# Patient Record
Sex: Male | Born: 1965
Health system: Southern US, Community
[De-identification: ages and names within clinical notes are randomized; demographics above are authoritative.]

## PROBLEM LIST (undated history)

## (undated) DIAGNOSIS — H9190 Unspecified hearing loss, unspecified ear: Secondary | ICD-10-CM

## (undated) DIAGNOSIS — C649 Malignant neoplasm of unspecified kidney, except renal pelvis: Secondary | ICD-10-CM

## (undated) DIAGNOSIS — E785 Hyperlipidemia, unspecified: Secondary | ICD-10-CM

## (undated) HISTORY — PX: TOTAL NEPHRECTOMY: SHX415

---

## 2012-12-02 DIAGNOSIS — M159 Polyosteoarthritis, unspecified: Secondary | ICD-10-CM | POA: Insufficient documentation

## 2012-12-02 DIAGNOSIS — J301 Allergic rhinitis due to pollen: Secondary | ICD-10-CM | POA: Insufficient documentation

## 2013-10-16 DIAGNOSIS — F17200 Nicotine dependence, unspecified, uncomplicated: Secondary | ICD-10-CM | POA: Insufficient documentation

## 2013-10-16 DIAGNOSIS — H6980 Other specified disorders of Eustachian tube, unspecified ear: Secondary | ICD-10-CM | POA: Insufficient documentation

## 2013-10-16 DIAGNOSIS — H905 Unspecified sensorineural hearing loss: Secondary | ICD-10-CM | POA: Insufficient documentation

## 2014-05-04 DIAGNOSIS — E782 Mixed hyperlipidemia: Secondary | ICD-10-CM | POA: Insufficient documentation

## 2015-05-01 DIAGNOSIS — Z8042 Family history of malignant neoplasm of prostate: Secondary | ICD-10-CM | POA: Insufficient documentation

## 2015-05-01 DIAGNOSIS — N411 Chronic prostatitis: Secondary | ICD-10-CM | POA: Insufficient documentation

## 2017-02-10 ENCOUNTER — Emergency Department (HOSPITAL_BASED_OUTPATIENT_CLINIC_OR_DEPARTMENT_OTHER)
Admission: EM | Admit: 2017-02-10 | Discharge: 2017-02-10 | Disposition: A | Discharge status: Home / Self Care | Payer: 59 | Attending: Emergency Medicine | Admitting: Emergency Medicine

## 2017-02-10 ENCOUNTER — Emergency Department (HOSPITAL_BASED_OUTPATIENT_CLINIC_OR_DEPARTMENT_OTHER): Payer: 59

## 2017-02-10 ENCOUNTER — Other Ambulatory Visit: Payer: Self-pay

## 2017-02-10 ENCOUNTER — Encounter (HOSPITAL_BASED_OUTPATIENT_CLINIC_OR_DEPARTMENT_OTHER): Payer: Self-pay | Admitting: *Deleted

## 2017-02-10 DIAGNOSIS — M25511 Pain in right shoulder: Secondary | ICD-10-CM | POA: Insufficient documentation

## 2017-02-10 DIAGNOSIS — G8929 Other chronic pain: Secondary | ICD-10-CM | POA: Diagnosis not present

## 2017-02-10 DIAGNOSIS — Z79899 Other long term (current) drug therapy: Secondary | ICD-10-CM | POA: Insufficient documentation

## 2017-02-10 DIAGNOSIS — F17210 Nicotine dependence, cigarettes, uncomplicated: Secondary | ICD-10-CM | POA: Insufficient documentation

## 2017-02-10 HISTORY — DX: Hyperlipidemia, unspecified: E78.5

## 2017-02-10 HISTORY — DX: Unspecified hearing loss, unspecified ear: H91.90

## 2017-02-10 NOTE — ED Notes (Signed)
ED Provider at bedside. 

## 2017-02-10 NOTE — ED Triage Notes (Signed)
Pt c/o right shoulder injury x 4 days ago increased pain with movt

## 2017-02-10 NOTE — ED Provider Notes (Signed)
[facility] EMERGENCY DEPARTMENT Provider Note   CSN: [id number] Arrival date & time: 02/10/17  1540     History   Chief Complaint Chief Complaint  Patient presents with  . Shoulder Injury    HPI Maurice Watkins is a 52 y.o. male.  HPI  Maurice Watkins male with a history of hyperlipidemia who presents to the emergency department for evaluation of right shoulder pain.  Patient states that he has had this pain for several months, although seems to have worsened over the past 4 days.  He reports that pain is located grossly over the right shoulder and upper arm.  Pain is moderate in severity and "dull and aching" in nature.  Shoulder pain seems to be worse when he is lying still or when he tries to bring his right hand to his left shoulder.  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  He denies any specific injury that he can think of.  Reports that being at work makes his shoulder pain worse.  Has tried taking ibuprofen and meloxicam, but has not had significant relief.  He denies numbness, weakness, fever, chills, open wound.   Past Medical History:  Diagnosis Date  . HOH (hard of hearing)   . Hyperlipidemia     There are no active problems to display for this patient.   History reviewed. No pertinent surgical history.     Home Medications    Prior to Admission medications   Medication Sig Start Date End Date Taking? Authorizing Provider  pravastatin (PRAVACHOL) 20 MG tablet Take 20 mg by mouth daily.   Yes [provider]    Family History History reviewed. No pertinent family history.  Social History Social History   Tobacco Use  . Smoking status: Current Every Day Smoker    Packs/day: 0.50    Types: Cigarettes  . Smokeless tobacco: Never Used  Substance Use Topics  . Alcohol use: No    Frequency: Never  . Drug use: No     Allergies   Meloxicam   Review of Systems Review of  Systems  Constitutional: Negative for chills, fatigue and fever.  Musculoskeletal: Positive for arthralgias (right shoulder). Negative for joint swelling.  Skin: Negative for rash and wound.  Neurological: Negative for weakness and numbness.     Physical Exam Updated Vital Signs BP (!) 145/85   Pulse 87   Temp 97.8 F (36.6 C)   Resp 16   Ht 5\' 5"  (1.651 m)   Wt 65.8 kg (145 lb)   SpO2 100%   BMI 24.13 kg/m   Physical Exam  Constitutional: He appears well-developed and well-nourished. No distress.  HENT:  Head: Normocephalic and atraumatic.  Eyes: Right eye exhibits no discharge. Left eye exhibits no discharge.  Pulmonary/Chest: Effort normal. No respiratory distress.  Musculoskeletal: Normal range of motion.  Right shoulder grossly tender to palpation. No specific point tenderness over the clavicle, scapular spine or humeral head. Full active ROM, although tender with abduction. Negative empty can test, Negative Neer's. No swelling, erythema or ecchymosis present. No step-off, crepitus, or deformity appreciated. 5/5 muscle strength of UE. 2+ radial pulse, sensation intact and all compartments soft.   Neurological: He is alert. Coordination normal.  Skin: Skin is warm and dry. Capillary refill takes less than 2 seconds. He is not diaphoretic.  Psychiatric: He has a normal mood and affect. His behavior is normal.  Nursing note and  vitals reviewed.    ED Treatments / Results  Labs (all labs ordered are listed, but only abnormal results are displayed) Labs Reviewed - No data to display  EKG  EKG Interpretation None       Radiology Dg Shoulder Right  Result Date: 02/10/2017 CLINICAL DATA:  Right shoulder pain for several months. EXAM: RIGHT SHOULDER - 2+ VIEW COMPARISON:  None. FINDINGS: The glenohumeral joint is normal. There are mild AC joint degenerative changes. No acute abnormality or abnormal soft tissue calcifications. The visualized right lung and right ribs  are intact. IMPRESSION: No acute or significant bony findings. Electronically Signed   By: Marijo Sanes M.D.   On: 02/10/2017 09:57    Procedures Procedures (including critical care time)  Medications Ordered in ED Medications - No data to display   Initial Impression / Assessment and Plan / ED Course  I have reviewed the triage vital signs and the nursing notes.  Pertinent labs & imaging results that were available during my care of the patient were reviewed by me and considered in my medical decision making (see chart for details).  Clinical Course as of Feb 10 1010  Thu Feb 10, 2017  1012 DG Shoulder Right [ES]    Clinical Course User Index [ES] Glyn Ade, PA-C   Patient presents with chronic right shoulder pain.  X-ray of right shoulder negative for acute fracture or abnormality.  Strength 5/5 and right upper extremity is neurovascularly intact.  Have counseled patient on use of NSAIDs for pain and will give him information to follow-up with sports medicine.  Discussed return precautions and patient agrees and voices understanding.  His blood pressure was elevated in the ER today, counseled him to have this rechecked.  Final Clinical Impressions(s) / ED Diagnoses   Final diagnoses:  Chronic right shoulder pain    ED Discharge Orders    None       Bernarda Caffey 02/10/17 1117    Fatima Blank, MD 02/10/17 [phone]

## 2017-02-10 NOTE — Discharge Instructions (Signed)
The x-ray of your shoulder was reassuring.  No fracture or dislocation.  Please take 600 mg ibuprofen every 6 hours as needed for pain.  I have listed the information below to sports medicine Dr. Barbaraann Barthel for follow-up, you can also contact your primary care doctor for follow-up if your symptoms are not improving.  Your blood pressure was elevated in the ER today, please have this rechecked.  Return to the ER if you have any new or worsening symptoms.

## 2017-02-15 ENCOUNTER — Ambulatory Visit (INDEPENDENT_AMBULATORY_CARE_PROVIDER_SITE_OTHER): Payer: 59 | Admitting: Family Medicine

## 2017-02-15 ENCOUNTER — Encounter: Payer: Self-pay | Admitting: Family Medicine

## 2017-02-15 VITALS — BP 117/73 | HR 85 | Ht 66.0 in | Wt 142.0 lb

## 2017-02-15 DIAGNOSIS — M25511 Pain in right shoulder: Secondary | ICD-10-CM | POA: Diagnosis not present

## 2017-02-15 DIAGNOSIS — G8929 Other chronic pain: Secondary | ICD-10-CM | POA: Diagnosis not present

## 2017-02-15 MED ORDER — NITROGLYCERIN 0.2 MG/HR TD PT24: MEDICATED_PATCH | TRANSDERMAL | 1 refills | Status: AC

## 2017-02-15 NOTE — Patient Instructions (Signed)
You have rotator cuff impingement. Try to avoid painful activities (overhead activities, lifting with extended arm) as much as possible. Consider aspercreme up to 4 times a day topically for pain and inflammation.  Voltaren gel is a consideration as well - let me know if you want me to send this in. Nitro patches 1/4th patch to affected shoulder, change daily. Can take tylenol in addition to this. Subacromial injection may be beneficial to help with pain and to decrease inflammation. Consider physical therapy with transition to home exercise program. Do home exercise program with theraband and scapular stabilization exercises daily 3 sets of 10 once a day - start with yellow theraband, advance to using the red one when tolerated. If not improving at follow-up we will consider imaging, injection, physical therapy. Follow up with me in 6 weeks for reevaluation.

## 2017-02-15 NOTE — Progress Notes (Signed)
PCP: Health, [facility]  Subjective:   HPI: Patient is a 52 y.o. male here for right shoulder pain.  Patient reports about 4 months ago he started to get pain in lateral, superior, posterior right shoulder. 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 No acute injury. Has tried ibuprofen, icy hot, heat, mobic (allergic reaction to this). Cannot sleep due to pain. Pain level 8/10 and sharp. No skin changes, numbness.  Past Medical History:  Diagnosis Date  . HOH (hard of hearing)   . Hyperlipidemia     Current Outpatient Medications on File Prior to Visit  Medication Sig Dispense Refill  . azelastine (ASTELIN) 0.1 % nasal spray Place into the nose.    . tamsulosin (FLOMAX) 0.4 MG CAPS capsule Take 0.4 mg by mouth.    . pravastatin (PRAVACHOL) 20 MG tablet Take 20 mg by mouth daily.     No current facility-administered medications on file prior to visit.     History reviewed. No pertinent surgical history.  Allergies  Allergen Reactions  . Eggs Or Egg-Derived Products Other (See Comments)    No reaction listed-ask pt and enter Childhood   . Influenza Vaccines   . Meloxicam     Social History   Socioeconomic History  . Marital status: Married    Spouse name: Not on file  . Number of children: Not on file  . Years of education: Not on file  . Highest education level: Not on file  Social Needs  . Financial resource strain: Not on file  . Food insecurity - worry: Not on file  . Food insecurity - inability: Not on file  . Transportation needs - medical: Not on file  . Transportation needs - non-medical: Not on file  Occupational History  . Not on file  Tobacco Use  . Smoking status: Current Every Day Smoker    Packs/day: 0.50    Types: Cigarettes  . Smokeless tobacco: Never Used  Substance and Sexual Activity  . Alcohol use: No    Frequency: Never  . Drug use: No  . Sexual  activity: Not on file  Other Topics Concern  . Not on file  Social History Narrative  . Not on file    History reviewed. No pertinent family history.  BP 117/73   Pulse 85   Ht 5\' 6"  (1.676 m)   Wt 142 lb (64.4 kg)   BMI 22.92 kg/m   Review of Systems: See HPI above.     Objective:  Physical Exam:  Gen: NAD, comfortable in exam room  Right shoulder: No swelling, ecchymoses.  No gross deformity. No TTP AC joint, biceps tendon. FROM with painful arc. Positive Hawkins, Neers. Negative Yergasons. Strength 5/5 with empty can and resisted internal/external rotation.  Pain empty can, ER. Negative apprehension. NV intact distally.  Left shoulder: No swelling, ecchymoses.  No gross deformity. No TTP. FROM. Strength 5/5 with empty can and resisted internal/external rotation. NV intact distally.   Assessment & Plan:  1. Right shoulder pain - consistent with rotator cuff impingement from repetitive activities at work.  Discussed options - will start with home exercise program, nitro patches.  Topical aspercreme.  Consider voltaren gel, imaging, injection, PT if not improving.  F/u in 6 weeks.

## 2017-02-15 NOTE — Assessment & Plan Note (Signed)
consistent with rotator cuff impingement from repetitive activities at work.  Discussed options - will start with home exercise program, nitro patches.  Topical aspercreme.  Consider voltaren gel, imaging, injection, PT if not improving.  F/u in 6 weeks.

## 2017-02-27 ENCOUNTER — Emergency Department (HOSPITAL_BASED_OUTPATIENT_CLINIC_OR_DEPARTMENT_OTHER): Payer: 59

## 2017-02-27 ENCOUNTER — Emergency Department (HOSPITAL_BASED_OUTPATIENT_CLINIC_OR_DEPARTMENT_OTHER)
Admission: EM | Admit: 2017-02-27 | Discharge: 2017-02-27 | Disposition: A | Discharge status: Home / Self Care | Payer: 59 | Attending: Emergency Medicine | Admitting: Emergency Medicine

## 2017-02-27 ENCOUNTER — Encounter (HOSPITAL_BASED_OUTPATIENT_CLINIC_OR_DEPARTMENT_OTHER): Payer: Self-pay | Admitting: Emergency Medicine

## 2017-02-27 DIAGNOSIS — Z79899 Other long term (current) drug therapy: Secondary | ICD-10-CM | POA: Diagnosis not present

## 2017-02-27 DIAGNOSIS — R39198 Other difficulties with micturition: Secondary | ICD-10-CM | POA: Diagnosis not present

## 2017-02-27 DIAGNOSIS — J111 Influenza due to unidentified influenza virus with other respiratory manifestations: Secondary | ICD-10-CM | POA: Diagnosis not present

## 2017-02-27 DIAGNOSIS — R109 Unspecified abdominal pain: Secondary | ICD-10-CM | POA: Diagnosis present

## 2017-02-27 DIAGNOSIS — R3912 Poor urinary stream: Secondary | ICD-10-CM | POA: Insufficient documentation

## 2017-02-27 DIAGNOSIS — R69 Illness, unspecified: Secondary | ICD-10-CM

## 2017-02-27 DIAGNOSIS — F17210 Nicotine dependence, cigarettes, uncomplicated: Secondary | ICD-10-CM | POA: Diagnosis not present

## 2017-02-27 DIAGNOSIS — N411 Chronic prostatitis: Secondary | ICD-10-CM | POA: Diagnosis not present

## 2017-02-27 DIAGNOSIS — Z7902 Long term (current) use of antithrombotics/antiplatelets: Secondary | ICD-10-CM | POA: Insufficient documentation

## 2017-02-27 LAB — URINALYSIS, ROUTINE W REFLEX MICROSCOPIC
Bilirubin Urine: NEGATIVE
Glucose, UA: NEGATIVE mg/dL
Ketones, ur: NEGATIVE mg/dL
Leukocytes, UA: NEGATIVE
NITRITE: NEGATIVE
PH: 7 (ref 5.0–8.0)
Protein, ur: NEGATIVE mg/dL

## 2017-02-27 LAB — URINALYSIS, MICROSCOPIC (REFLEX): SQUAMOUS EPITHELIAL / LPF: NONE SEEN

## 2017-02-27 LAB — CBG MONITORING, ED: GLUCOSE-CAPILLARY: 87 mg/dL (ref 65–99)

## 2017-02-27 MED ORDER — KETOROLAC TROMETHAMINE 30 MG/ML IJ SOLN
30.0000 mg | Freq: Once | INTRAMUSCULAR | Status: AC
  Administered 2017-02-27: 30 mg via INTRAVENOUS
  Filled 2017-02-27: qty 1

## 2017-02-27 MED ORDER — TAMSULOSIN HCL 0.4 MG PO CAPS: 0.4000 mg | ORAL_CAPSULE | Freq: Every day | ORAL | 0 refills | Status: AC

## 2017-02-27 MED ORDER — CIPROFLOXACIN HCL 500 MG PO TABS: 500.0000 mg | ORAL_TABLET | Freq: Two times a day (BID) | ORAL | 0 refills | Status: AC

## 2017-02-27 NOTE — ED Provider Notes (Addendum)
[facility] EMERGENCY DEPARTMENT Provider Note   CSN: [id number] Arrival date & time: 02/27/17  1546     History   Chief Complaint Chief Complaint  Patient presents with  . Flank Pain    HPI Maurice Watkins is a 52 y.o. male.  HPI Patient presents to the emergency department with decreased flow of urination.  The patient states that he is having difficulty urinating.  He states there is some discomfort also noted.  The patient states that he has had a history of chronic prostatitis that is caused some urinary issues like this in the past.  He states that he is also had some intermittent flank pain over the last few days.  Patient states that he has a son at home who is sick with influenza and is concerned that he may be developing this as well.  The patient states that nothing seems to make the condition better or worse.  The patient states that he normally will take Flomax for his symptoms when they occur.  The patient denies chest pain, shortness of breath, headache,blurred vision, neck pain, fever, cough, weakness, numbness, dizziness, anorexia, edema,  nausea, vomiting, diarrhea, rash, back pain, dysuria, hematemesis, bloody stool, near syncope, or syncope. Past Medical History:  Diagnosis Date  . HOH (hard of hearing)   . Hyperlipidemia     Patient Active Problem List   Diagnosis Date Noted  . Right shoulder pain 02/15/2017  . Chronic prostatitis 05/01/2015  . Family history of prostate cancer in father 05/01/2015  . Mixed hyperlipidemia 05/04/2014  . Sensorineural hearing loss 10/16/2013  . Current smoker 10/16/2013  . ETD (eustachian tube dysfunction) 10/16/2013  . Generalized osteoarthritis of multiple sites 12/02/2012  . Allergic rhinitis due to pollen 12/02/2012    History reviewed. No pertinent surgical history.     Home Medications    Prior to Admission medications   Medication Sig Start Date End Date Taking? Authorizing Provider  azelastine  (ASTELIN) 0.1 % nasal spray Place into the nose. 04/07/16   [provider]  nitroGLYCERIN (NITRODUR - DOSED IN MG/24 HR) 0.2 mg/hr patch Apply 1/4th patch to affected shoulder, change daily 02/15/17   Hudnall, Sharyn Lull, MD  pravastatin (PRAVACHOL) 20 MG tablet Take 20 mg by mouth daily.    [provider]  tamsulosin (FLOMAX) 0.4 MG CAPS capsule Take 0.4 mg by mouth. 08/21/14   [provider]    Family History No family history on file.  Social History Social History   Tobacco Use  . Smoking status: Current Every Day Smoker    Packs/day: 0.50    Types: Cigarettes  . Smokeless tobacco: Never Used  Substance Use Topics  . Alcohol use: No    Frequency: Never  . Drug use: No     Allergies   Eggs or egg-derived products; Influenza vaccines; and Meloxicam   Review of Systems Review of Systems All other systems negative except as documented in the HPI. All pertinent positives and negatives as reviewed in the HPI. Physical Exam Updated Vital Signs BP 111/75 (BP Location: Right Arm)   Pulse 93   Temp (!) 100.4 F (38 C) (Oral)   Resp 18   SpO2 96%   Physical Exam  Constitutional: He is oriented to person, place, and time. He appears well-developed and well-nourished. No distress.  HENT:  Head: Normocephalic and atraumatic.  Mouth/Throat: Oropharynx is clear and moist.  Eyes: Pupils are equal, round, and reactive to light.  Neck: Normal range  of motion. Neck supple.  Cardiovascular: Normal rate, regular rhythm and normal heart sounds. Exam reveals no gallop and no friction rub.  No murmur heard. Pulmonary/Chest: Effort normal and breath sounds normal. No respiratory distress. He has no wheezes.  Abdominal: Soft. Bowel sounds are normal. He exhibits no distension and no mass. There is tenderness in the suprapubic area. There is no guarding.  Neurological: He is alert and oriented to person, place, and time. He exhibits normal muscle tone. Coordination  normal.  Skin: Skin is warm and dry. Capillary refill takes less than 2 seconds. No rash noted. No erythema.  Psychiatric: He has a normal mood and affect. His behavior is normal.  Nursing note and vitals reviewed.    ED Treatments / Results  Labs (all labs ordered are listed, but only abnormal results are displayed) Labs Reviewed  URINALYSIS, ROUTINE W REFLEX MICROSCOPIC - Abnormal; Notable for the following components:      Result Value   Specific Gravity, Urine <1.005 (*)    Hgb urine dipstick TRACE (*)    All other components within normal limits  URINALYSIS, MICROSCOPIC (REFLEX) - Abnormal; Notable for the following components:   Bacteria, UA FEW (*)    All other components within normal limits  CBG MONITORING, ED    EKG  EKG Interpretation None       Radiology Ct Renal Stone Study  Result Date: 02/27/2017 CLINICAL DATA:  Left flank pain.  Stone disease suspected. EXAM: CT ABDOMEN AND PELVIS WITHOUT CONTRAST TECHNIQUE: Multidetector CT imaging of the abdomen and pelvis was performed following the standard protocol without IV contrast. COMPARISON:  None. FINDINGS: Lower chest: No acute findings. Hepatobiliary: No mass visualized on this unenhanced exam. Gallbladder is unremarkable. Pancreas: No mass or inflammatory process visualized on this unenhanced exam. Spleen:  Within normal limits in size. Adrenals/Urinary tract: No evidence of urolithiasis or hydronephrosis. Unremarkable unopacified urinary bladder. Stomach/Bowel: No evidence of obstruction, inflammatory process, or abnormal fluid collections. Vascular/Lymphatic: No pathologically enlarged lymph nodes identified. No evidence of abdominal aortic aneurysm. Aortic atherosclerosis. Reproductive:  No mass or other significant abnormality. Other:  None. Musculoskeletal:  No suspicious bone lesions identified. IMPRESSION: No evidence of urolithiasis, hydronephrosis, or other acute findings. The Aortic atherosclerosis.  Electronically Signed   By: Earle Gell M.D.   On: 02/27/2017 18:47    Procedures Procedures (including critical care time)  Medications Ordered in ED Medications - No data to display   Initial Impression / Assessment and Plan / ED Course  I have reviewed the triage vital signs and the nursing notes.  Pertinent labs & imaging results that were available during my care of the patient were reviewed by me and considered in my medical decision making (see chart for details).     Patient also has some upper respiratory symptoms that could be consistent with the flu like his son has.  The patient is advised to return here as needed.  Follow-up with his urologist.  Patient is advised to also follow-up with his primary doctor.  Patient CT scan did not show any significant abnormalities as related to his current issue.  I did advise the patient to return for any worsening in his condition.  I feel that this is a prostate related issue prostatitis we will treat for this.  Final Clinical Impressions(s) / ED Diagnoses   Final diagnoses:  None    ED Discharge Orders    None       Dalia Heading, [place] 02/27/17 1931  Isla Pence, MD 02/27/17 2201    Dalia Heading, PA-C 03/08/17 [place], Julie, MD 03/08/17 [phone]

## 2017-02-27 NOTE — ED Notes (Signed)
RX x 2 given for flomax and cipro. Note for work given

## 2017-02-27 NOTE — ED Triage Notes (Signed)
Pt c/o LT flank pain; reports urine stream weaker since Fri

## 2017-02-27 NOTE — Discharge Instructions (Addendum)
follow-up with your urologist. return here as needed.  Increase fluid intake and rest as much as possible.

## 2017-02-27 NOTE — ED Notes (Signed)
Patient transported to CT 

## 2017-03-01 DIAGNOSIS — N410 Acute prostatitis: Secondary | ICD-10-CM | POA: Diagnosis not present

## 2017-03-01 DIAGNOSIS — K5900 Constipation, unspecified: Secondary | ICD-10-CM | POA: Diagnosis not present

## 2017-03-01 DIAGNOSIS — R319 Hematuria, unspecified: Secondary | ICD-10-CM | POA: Diagnosis not present

## 2017-03-02 DIAGNOSIS — R531 Weakness: Secondary | ICD-10-CM | POA: Diagnosis not present

## 2017-03-18 DIAGNOSIS — R310 Gross hematuria: Secondary | ICD-10-CM | POA: Diagnosis not present

## 2017-03-18 DIAGNOSIS — I700 Atherosclerosis of aorta: Secondary | ICD-10-CM | POA: Diagnosis not present

## 2017-03-18 DIAGNOSIS — N2889 Other specified disorders of kidney and ureter: Secondary | ICD-10-CM | POA: Diagnosis not present

## 2017-03-21 DIAGNOSIS — R310 Gross hematuria: Secondary | ICD-10-CM | POA: Diagnosis not present

## 2017-03-21 DIAGNOSIS — Z01810 Encounter for preprocedural cardiovascular examination: Secondary | ICD-10-CM | POA: Diagnosis not present

## 2017-03-21 DIAGNOSIS — E782 Mixed hyperlipidemia: Secondary | ICD-10-CM | POA: Diagnosis not present

## 2017-03-21 DIAGNOSIS — C642 Malignant neoplasm of left kidney, except renal pelvis: Secondary | ICD-10-CM | POA: Diagnosis not present

## 2017-03-24 DIAGNOSIS — R310 Gross hematuria: Secondary | ICD-10-CM | POA: Diagnosis not present

## 2017-03-29 ENCOUNTER — Encounter: Payer: Self-pay | Admitting: Family Medicine

## 2017-03-29 ENCOUNTER — Ambulatory Visit: Payer: 59 | Admitting: Family Medicine

## 2017-03-29 DIAGNOSIS — M25511 Pain in right shoulder: Secondary | ICD-10-CM | POA: Diagnosis not present

## 2017-03-29 DIAGNOSIS — G8929 Other chronic pain: Secondary | ICD-10-CM | POA: Diagnosis not present

## 2017-03-29 NOTE — Patient Instructions (Addendum)
You can stop the nitro patches now but don't throw them away just in case the pain comes back (though this is unlikely at this point). Do the home exercises 3 times a week for the next 4 weeks. Follow up with me as needed. Good luck with the surgery next Tuesday!

## 2017-03-29 NOTE — Assessment & Plan Note (Signed)
2/2 rotator cuff impingement.  Doing well.  Stop nitro patches.  Continue home exercises 3x/week for 4 more weeks.  Tylenol, motrin only if needed.  F/u prn.

## 2017-03-29 NOTE — Progress Notes (Signed)
PCP: Health, [facility]  Subjective:   HPI: Patient is a 52 y.o. male here for right shoulder pain.  1/15: Patient reports about 4 months ago he started to get pain in lateral, superior, posterior right shoulder. 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 No acute injury. Has tried ibuprofen, icy hot, heat, mobic (allergic reaction to this). Cannot sleep due to pain. Pain level 8/10 and sharp. No skin changes, numbness.  2/26: Patient reports he's doing well. No pain now. Able to sleep at  Night without a problem. Has been using full patches on his shoulder instead of 1/4th patch. No skin changes, numbness.  Past Medical History:  Diagnosis Date  . HOH (hard of hearing)   . Hyperlipidemia     Current Outpatient Medications on File Prior to Visit  Medication Sig Dispense Refill  . Ascorbic Acid (VITAMIN C) 100 MG tablet Take by mouth.    Marland Kitchen azelastine (ASTELIN) 0.1 % nasal spray Place into the nose.    . B Complex Vitamins (VITAMIN B COMPLEX PO) Take by mouth.    . Cholecalciferol (VITAMIN D-1000 MAX ST) 1000 units tablet Take by mouth.    Marland Kitchen NICODERM CQ 21 MG/24HR patch     . nitroGLYCERIN (NITRODUR - DOSED IN MG/24 HR) 0.2 mg/hr patch Apply 1/4th patch to affected shoulder, change daily 30 patch 1  . oxybutynin (DITROPAN-XL) 10 MG 24 hr tablet     . pravastatin (PRAVACHOL) 20 MG tablet Take 20 mg by mouth daily.    . tamsulosin (FLOMAX) 0.4 MG CAPS capsule Take 1 capsule (0.4 mg total) by mouth daily. 30 capsule 0  . vitamin A 10000 UNIT capsule Take by mouth.    . vitamin E 100 UNIT capsule Take by mouth.     No current facility-administered medications on file prior to visit.     History reviewed. No pertinent surgical history.  Allergies  Allergen Reactions  . Eggs Or Egg-Derived Products Other (See Comments)    No reaction listed-ask pt and enter Childhood   . Influenza  Vaccines   . Meloxicam     Social History   Socioeconomic History  . Marital status: Married    Spouse name: Not on file  . Number of children: Not on file  . Years of education: Not on file  . Highest education level: Not on file  Social Needs  . Financial resource strain: Not on file  . Food insecurity - worry: Not on file  . Food insecurity - inability: Not on file  . Transportation needs - medical: Not on file  . Transportation needs - non-medical: Not on file  Occupational History  . Not on file  Tobacco Use  . Smoking status: Current Every Day Smoker    Packs/day: 0.50    Types: Cigarettes  . Smokeless tobacco: Never Used  Substance and Sexual Activity  . Alcohol use: No    Frequency: Never  . Drug use: No  . Sexual activity: Not on file  Other Topics Concern  . Not on file  Social History Narrative  . Not on file    History reviewed. No pertinent family history.  BP 118/78   Pulse 86   Ht 5\' 5"  (1.651 m)   Wt 139 lb (63 kg)   BMI 23.13 kg/m   Review of Systems: See HPI above.     Objective:  Physical Exam:  Gen: NAD, comfortable in exam room.  Right shoulder: No swelling, ecchymoses.  No gross deformity. No TTP. FROM. Negative Hawkins, Neers. Negative Yergasons. Strength 5/5 with empty can and resisted internal/external rotation. NV intact distally.   Assessment & Plan:  1. Right shoulder pain - 2/2 rotator cuff impingement.  Doing well.  Stop nitro patches.  Continue home exercises 3x/week for 4 more weeks.  Tylenol, motrin only if needed.  F/u prn.

## 2017-03-31 DIAGNOSIS — K921 Melena: Secondary | ICD-10-CM | POA: Diagnosis not present

## 2017-03-31 DIAGNOSIS — K648 Other hemorrhoids: Secondary | ICD-10-CM | POA: Diagnosis not present

## 2017-03-31 DIAGNOSIS — K6289 Other specified diseases of anus and rectum: Secondary | ICD-10-CM | POA: Diagnosis not present

## 2017-04-01 DIAGNOSIS — R310 Gross hematuria: Secondary | ICD-10-CM | POA: Diagnosis not present

## 2017-04-01 DIAGNOSIS — C801 Malignant (primary) neoplasm, unspecified: Secondary | ICD-10-CM | POA: Diagnosis not present

## 2017-04-01 DIAGNOSIS — K648 Other hemorrhoids: Secondary | ICD-10-CM | POA: Diagnosis not present

## 2017-04-01 DIAGNOSIS — Z01812 Encounter for preprocedural laboratory examination: Secondary | ICD-10-CM | POA: Diagnosis not present

## 2017-04-01 DIAGNOSIS — Z01810 Encounter for preprocedural cardiovascular examination: Secondary | ICD-10-CM | POA: Diagnosis not present

## 2017-04-05 DIAGNOSIS — C642 Malignant neoplasm of left kidney, except renal pelvis: Secondary | ICD-10-CM | POA: Diagnosis not present

## 2017-04-05 DIAGNOSIS — M2141 Flat foot [pes planus] (acquired), right foot: Secondary | ICD-10-CM | POA: Diagnosis not present

## 2017-04-05 DIAGNOSIS — M722 Plantar fascial fibromatosis: Secondary | ICD-10-CM | POA: Diagnosis not present

## 2017-04-05 DIAGNOSIS — R066 Hiccough: Secondary | ICD-10-CM | POA: Diagnosis not present

## 2017-04-05 DIAGNOSIS — K9131 Postprocedural partial intestinal obstruction: Secondary | ICD-10-CM | POA: Diagnosis not present

## 2017-04-05 DIAGNOSIS — R140 Abdominal distension (gaseous): Secondary | ICD-10-CM | POA: Diagnosis not present

## 2017-04-05 DIAGNOSIS — M24571 Contracture, right ankle: Secondary | ICD-10-CM | POA: Diagnosis not present

## 2017-04-05 DIAGNOSIS — K56600 Partial intestinal obstruction, unspecified as to cause: Secondary | ICD-10-CM | POA: Diagnosis not present

## 2017-04-05 DIAGNOSIS — C652 Malignant neoplasm of left renal pelvis: Secondary | ICD-10-CM | POA: Diagnosis not present

## 2017-04-05 DIAGNOSIS — I10 Essential (primary) hypertension: Secondary | ICD-10-CM | POA: Diagnosis not present

## 2017-04-05 DIAGNOSIS — R310 Gross hematuria: Secondary | ICD-10-CM | POA: Diagnosis not present

## 2017-04-05 DIAGNOSIS — R000 Tachycardia, unspecified: Secondary | ICD-10-CM | POA: Diagnosis not present

## 2017-04-05 DIAGNOSIS — Z452 Encounter for adjustment and management of vascular access device: Secondary | ICD-10-CM | POA: Diagnosis not present

## 2017-04-05 DIAGNOSIS — R1084 Generalized abdominal pain: Secondary | ICD-10-CM | POA: Diagnosis not present

## 2017-04-05 DIAGNOSIS — Z978 Presence of other specified devices: Secondary | ICD-10-CM | POA: Diagnosis not present

## 2017-04-05 DIAGNOSIS — Z9889 Other specified postprocedural states: Secondary | ICD-10-CM | POA: Diagnosis not present

## 2017-04-05 DIAGNOSIS — E871 Hypo-osmolality and hyponatremia: Secondary | ICD-10-CM | POA: Diagnosis not present

## 2017-04-05 DIAGNOSIS — R933 Abnormal findings on diagnostic imaging of other parts of digestive tract: Secondary | ICD-10-CM | POA: Diagnosis not present

## 2017-04-05 DIAGNOSIS — G8918 Other acute postprocedural pain: Secondary | ICD-10-CM | POA: Diagnosis not present

## 2017-04-05 DIAGNOSIS — E875 Hyperkalemia: Secondary | ICD-10-CM | POA: Diagnosis not present

## 2017-04-05 DIAGNOSIS — N179 Acute kidney failure, unspecified: Secondary | ICD-10-CM | POA: Diagnosis not present

## 2017-04-05 DIAGNOSIS — Z905 Acquired absence of kidney: Secondary | ICD-10-CM | POA: Diagnosis not present

## 2017-04-05 DIAGNOSIS — K567 Ileus, unspecified: Secondary | ICD-10-CM | POA: Diagnosis not present

## 2017-04-05 DIAGNOSIS — K56609 Unspecified intestinal obstruction, unspecified as to partial versus complete obstruction: Secondary | ICD-10-CM | POA: Diagnosis not present

## 2017-04-21 DIAGNOSIS — N179 Acute kidney failure, unspecified: Secondary | ICD-10-CM | POA: Diagnosis not present

## 2017-04-21 DIAGNOSIS — E876 Hypokalemia: Secondary | ICD-10-CM | POA: Diagnosis not present

## 2017-04-21 DIAGNOSIS — K56609 Unspecified intestinal obstruction, unspecified as to partial versus complete obstruction: Secondary | ICD-10-CM | POA: Diagnosis not present

## 2017-04-21 DIAGNOSIS — T8130XA Disruption of wound, unspecified, initial encounter: Secondary | ICD-10-CM | POA: Diagnosis not present

## 2017-04-29 DIAGNOSIS — N179 Acute kidney failure, unspecified: Secondary | ICD-10-CM | POA: Diagnosis not present

## 2017-04-29 DIAGNOSIS — N390 Urinary tract infection, site not specified: Secondary | ICD-10-CM | POA: Diagnosis not present

## 2017-04-29 DIAGNOSIS — E875 Hyperkalemia: Secondary | ICD-10-CM | POA: Diagnosis not present

## 2017-04-29 DIAGNOSIS — R310 Gross hematuria: Secondary | ICD-10-CM | POA: Diagnosis not present

## 2017-05-05 DIAGNOSIS — K625 Hemorrhage of anus and rectum: Secondary | ICD-10-CM | POA: Diagnosis not present

## 2017-05-06 DIAGNOSIS — K921 Melena: Secondary | ICD-10-CM | POA: Diagnosis not present

## 2017-05-06 DIAGNOSIS — K6289 Other specified diseases of anus and rectum: Secondary | ICD-10-CM | POA: Diagnosis not present

## 2017-05-10 DIAGNOSIS — K921 Melena: Secondary | ICD-10-CM | POA: Diagnosis not present

## 2017-05-10 DIAGNOSIS — K6389 Other specified diseases of intestine: Secondary | ICD-10-CM | POA: Diagnosis not present

## 2017-05-10 DIAGNOSIS — K529 Noninfective gastroenteritis and colitis, unspecified: Secondary | ICD-10-CM | POA: Diagnosis not present

## 2017-05-10 DIAGNOSIS — K641 Second degree hemorrhoids: Secondary | ICD-10-CM | POA: Diagnosis not present

## 2017-05-13 ENCOUNTER — Emergency Department (HOSPITAL_BASED_OUTPATIENT_CLINIC_OR_DEPARTMENT_OTHER)
Admission: EM | Admit: 2017-05-13 | Discharge: 2017-05-13 | Disposition: A | Discharge status: Home / Self Care | Payer: 59 | Attending: Emergency Medicine | Admitting: Emergency Medicine

## 2017-05-13 ENCOUNTER — Encounter (HOSPITAL_BASED_OUTPATIENT_CLINIC_OR_DEPARTMENT_OTHER): Payer: Self-pay

## 2017-05-13 ENCOUNTER — Other Ambulatory Visit: Payer: Self-pay

## 2017-05-13 DIAGNOSIS — M79601 Pain in right arm: Secondary | ICD-10-CM | POA: Diagnosis present

## 2017-05-13 DIAGNOSIS — I808 Phlebitis and thrombophlebitis of other sites: Secondary | ICD-10-CM | POA: Diagnosis not present

## 2017-05-13 DIAGNOSIS — M79602 Pain in left arm: Secondary | ICD-10-CM | POA: Diagnosis not present

## 2017-05-13 DIAGNOSIS — Z79899 Other long term (current) drug therapy: Secondary | ICD-10-CM | POA: Diagnosis not present

## 2017-05-13 DIAGNOSIS — Z87891 Personal history of nicotine dependence: Secondary | ICD-10-CM | POA: Insufficient documentation

## 2017-05-13 HISTORY — DX: Malignant neoplasm of unspecified kidney, except renal pelvis: C64.9

## 2017-05-13 NOTE — Discharge Instructions (Addendum)
Contact a health care provider if: You have unusual bruising or any bleeding problems. Your swelling or pain in the affected area is not improving. You are on anti-inflammatory medicine, and you develop belly (abdominal) pain. Get help right away if: You have a sudden onset of chest pain or difficulty breathing. You have a fever or persistent symptoms for more than 2-3 days. You have a fever and your symptoms suddenly get worse.

## 2017-05-13 NOTE — ED Triage Notes (Signed)
C/o "knots" to bilat UE-first noticed today-states he had recent surgery with multiple IV and a colonoscopy to left arm this week-NAD-steady gait

## 2017-05-13 NOTE — ED Provider Notes (Signed)
[facility]PART[facility] Provider Note   CSN: [id number] Arrival date & time: 05/13/17  2020     History   Chief Complaint Chief Complaint  Patient presents with  . Arm Problem    HPI Maurice Watkins is a 52 y.o. male  Who presents to the ER with cc of "knots" in his arms. The patient has a hx of recent nephrectomy for RCC. He had multiple IVs in both arms. He noticed painful cordlike lesions in both arms over the past week. He denies heat, redness, UL UE swelling, cp or sob.  HPI  Past Medical History:  Diagnosis Date  . Cancer of kidney ([place])   . HOH (hard of hearing)   . Hyperlipidemia     Patient Active Problem List   Diagnosis Date Noted  . Right shoulder pain 02/15/2017  . Chronic prostatitis 05/01/2015  . Family history of prostate cancer in father 05/01/2015  . Mixed hyperlipidemia 05/04/2014  . Sensorineural hearing loss 10/16/2013  . Current smoker 10/16/2013  . ETD (eustachian tube dysfunction) 10/16/2013  . Generalized osteoarthritis of multiple sites 12/02/2012  . Allergic rhinitis due to pollen 12/02/2012    Past Surgical History:  Procedure Laterality Date  . TOTAL NEPHRECTOMY          Home Medications    Prior to Admission medications   Medication Sig Start Date End Date Taking? Authorizing Provider  Ascorbic Acid (VITAMIN C) 100 MG tablet Take by mouth.    [provider]  azelastine (ASTELIN) 0.1 % nasal spray Place into the nose. 04/07/16   [provider]  B Complex Vitamins (VITAMIN B COMPLEX PO) Take by mouth.    [provider]  Cholecalciferol (VITAMIN D-1000 MAX ST) 1000 units tablet Take by mouth.    [provider]  NICODERM CQ 21 MG/24HR patch  03/24/17   [provider]  nitroGLYCERIN (NITRODUR - DOSED IN MG/24 HR) 0.2 mg/hr patch Apply 1/4th patch to affected shoulder, change daily 02/15/17   Hudnall, Sharyn Lull, MD  oxybutynin (DITROPAN-XL) 10 MG 24 hr tablet  03/21/17    [provider]  pravastatin (PRAVACHOL) 20 MG tablet Take 20 mg by mouth daily.    [provider]  tamsulosin (FLOMAX) 0.4 MG CAPS capsule Take 1 capsule (0.4 mg total) by mouth daily. 02/27/17   Lawyer, Harrell Gave, PA-C  vitamin A 10000 UNIT capsule Take by mouth.    [provider]  vitamin E 100 UNIT capsule Take by mouth.    [provider]    Family History No family history on file.  Social History Social History   Tobacco Use  . Smoking status: Former Smoker    Packs/day: 0.50    Types: Cigarettes  . Smokeless tobacco: Never Used  Substance Use Topics  . Alcohol use: No    Frequency: Never  . Drug use: No     Allergies   Eggs or egg-derived products; Influenza vaccines; and Meloxicam   Review of Systems Review of Systems  Ten systems reviewed and are negative for acute change, except as noted in the HPI.   Physical Exam Updated Vital Signs BP 135/77 (BP Location: Left Arm)   Pulse 96   Temp 98.8 F (37.1 C) (Oral)   Resp 18   Ht 5\' 6"  (1.676 m)   Wt 57.2 kg (126 lb 1.7 oz)   SpO2 100%   BMI 20.35 kg/m   Physical Exam  Constitutional: He appears well-developed and well-nourished.  No distress.  HENT:  Head: Normocephalic and atraumatic.  Eyes: Conjunctivae are normal. No scleral icterus.  Neck: Normal range of motion. Neck supple.  Cardiovascular: Normal rate, regular rhythm, normal heart sounds and intact distal pulses.  Tender, ropy, cordlike Lesions in single vein distribution bl UE. Normal radial pulses. No swelling.   Pulmonary/Chest: Effort normal and breath sounds normal. No respiratory distress.  Abdominal: Soft. There is no tenderness.  Musculoskeletal: Normal range of motion. He exhibits no edema.  Neurological: He is alert.  Skin: Skin is warm and dry. He is not diaphoretic.  Psychiatric: His behavior is normal.  Nursing note and vitals reviewed.    ED Treatments / Results  Labs (all labs ordered  are listed, but only abnormal results are displayed) Labs Reviewed - No data to display  EKG None  Radiology No results found.  Procedures Procedures (including critical care time)  Medications Ordered in ED Medications - No data to display   Initial Impression / Assessment and Plan / ED Course  I have reviewed the triage vital signs and the nursing notes.  Pertinent labs & imaging results that were available during my care of the patient were reviewed by me and considered in my medical decision making (see chart for details).     Superficial thrombophlebitis of BL upper extremities. No concern for cellulitis or DVT.  Discussed supportive care and return precautions.  Final Clinical Impressions(s) / ED Diagnoses   Final diagnoses:  Thrombophlebitis of arm    ED Discharge Orders    None       Margarita Mail, PA-C 05/13/17 2243    Blanchie Dessert, MD 05/14/17 [id number]

## 2017-05-17 DIAGNOSIS — I809 Phlebitis and thrombophlebitis of unspecified site: Secondary | ICD-10-CM | POA: Diagnosis not present

## 2017-05-24 DIAGNOSIS — R7989 Other specified abnormal findings of blood chemistry: Secondary | ICD-10-CM | POA: Diagnosis not present

## 2017-05-24 DIAGNOSIS — D649 Anemia, unspecified: Secondary | ICD-10-CM | POA: Diagnosis not present

## 2017-05-24 DIAGNOSIS — I809 Phlebitis and thrombophlebitis of unspecified site: Secondary | ICD-10-CM | POA: Diagnosis not present

## 2017-05-31 DIAGNOSIS — H903 Sensorineural hearing loss, bilateral: Secondary | ICD-10-CM | POA: Diagnosis not present

## 2017-05-31 DIAGNOSIS — H6983 Other specified disorders of Eustachian tube, bilateral: Secondary | ICD-10-CM | POA: Diagnosis not present

## 2017-05-31 DIAGNOSIS — R7989 Other specified abnormal findings of blood chemistry: Secondary | ICD-10-CM | POA: Diagnosis not present

## 2017-06-07 DIAGNOSIS — C642 Malignant neoplasm of left kidney, except renal pelvis: Secondary | ICD-10-CM | POA: Diagnosis not present

## 2017-06-08 DIAGNOSIS — R310 Gross hematuria: Secondary | ICD-10-CM | POA: Diagnosis not present

## 2017-06-08 DIAGNOSIS — E875 Hyperkalemia: Secondary | ICD-10-CM | POA: Diagnosis not present

## 2017-06-08 DIAGNOSIS — Z7689 Persons encountering health services in other specified circumstances: Secondary | ICD-10-CM | POA: Diagnosis not present

## 2017-06-08 DIAGNOSIS — N179 Acute kidney failure, unspecified: Secondary | ICD-10-CM | POA: Diagnosis not present

## 2017-06-10 DIAGNOSIS — E875 Hyperkalemia: Secondary | ICD-10-CM | POA: Diagnosis not present

## 2017-06-10 DIAGNOSIS — R310 Gross hematuria: Secondary | ICD-10-CM | POA: Diagnosis not present

## 2017-06-10 DIAGNOSIS — N179 Acute kidney failure, unspecified: Secondary | ICD-10-CM | POA: Diagnosis not present

## 2017-06-16 DIAGNOSIS — I808 Phlebitis and thrombophlebitis of other sites: Secondary | ICD-10-CM | POA: Diagnosis not present

## 2017-06-16 DIAGNOSIS — M79601 Pain in right arm: Secondary | ICD-10-CM | POA: Diagnosis not present

## 2017-07-15 DIAGNOSIS — C642 Malignant neoplasm of left kidney, except renal pelvis: Secondary | ICD-10-CM | POA: Diagnosis not present

## 2017-08-06 ENCOUNTER — Encounter (HOSPITAL_BASED_OUTPATIENT_CLINIC_OR_DEPARTMENT_OTHER): Payer: Self-pay | Admitting: Emergency Medicine

## 2017-08-06 ENCOUNTER — Emergency Department (HOSPITAL_BASED_OUTPATIENT_CLINIC_OR_DEPARTMENT_OTHER): Payer: 59

## 2017-08-06 ENCOUNTER — Other Ambulatory Visit: Payer: Self-pay

## 2017-08-06 ENCOUNTER — Emergency Department (HOSPITAL_BASED_OUTPATIENT_CLINIC_OR_DEPARTMENT_OTHER)
Admission: EM | Admit: 2017-08-06 | Discharge: 2017-08-06 | Disposition: A | Discharge status: Home / Self Care | Payer: 59 | Attending: Emergency Medicine | Admitting: Emergency Medicine

## 2017-08-06 DIAGNOSIS — K625 Hemorrhage of anus and rectum: Secondary | ICD-10-CM | POA: Diagnosis not present

## 2017-08-06 DIAGNOSIS — Z85528 Personal history of other malignant neoplasm of kidney: Secondary | ICD-10-CM | POA: Diagnosis not present

## 2017-08-06 DIAGNOSIS — Z79899 Other long term (current) drug therapy: Secondary | ICD-10-CM | POA: Insufficient documentation

## 2017-08-06 DIAGNOSIS — R1030 Lower abdominal pain, unspecified: Secondary | ICD-10-CM | POA: Diagnosis not present

## 2017-08-06 DIAGNOSIS — D649 Anemia, unspecified: Secondary | ICD-10-CM | POA: Insufficient documentation

## 2017-08-06 DIAGNOSIS — Z87891 Personal history of nicotine dependence: Secondary | ICD-10-CM | POA: Diagnosis not present

## 2017-08-06 DIAGNOSIS — R1032 Left lower quadrant pain: Secondary | ICD-10-CM | POA: Diagnosis not present

## 2017-08-06 LAB — URINALYSIS, ROUTINE W REFLEX MICROSCOPIC
Bilirubin Urine: NEGATIVE
Glucose, UA: NEGATIVE mg/dL
Ketones, ur: NEGATIVE mg/dL
Leukocytes, UA: NEGATIVE
NITRITE: NEGATIVE
Protein, ur: NEGATIVE mg/dL
pH: 6 (ref 5.0–8.0)

## 2017-08-06 LAB — CBC WITH DIFFERENTIAL/PLATELET
BASOS PCT: 0 %
Basophils Absolute: 0 10*3/uL (ref 0.0–0.1)
EOS ABS: 0 10*3/uL (ref 0.0–0.7)
Eosinophils Relative: 1 %
HCT: 36.6 % — ABNORMAL LOW (ref 39.0–52.0)
Hemoglobin: 12.6 g/dL — ABNORMAL LOW (ref 13.0–17.0)
LYMPHS ABS: 2.5 10*3/uL (ref 0.7–4.0)
Lymphocytes Relative: 53 %
MCH: 30.8 pg (ref 26.0–34.0)
MCHC: 34.4 g/dL (ref 30.0–36.0)
MCV: 89.5 fL (ref 78.0–100.0)
Monocytes Absolute: 0.5 10*3/uL (ref 0.1–1.0)
Monocytes Relative: 12 %
NEUTROS ABS: 1.6 10*3/uL — AB (ref 1.7–7.7)
Neutrophils Relative %: 34 %
Platelets: 248 10*3/uL (ref 150–400)
RBC: 4.09 MIL/uL — AB (ref 4.22–5.81)
RDW: 12.7 % (ref 11.5–15.5)
WBC: 4.7 10*3/uL (ref 4.0–10.5)

## 2017-08-06 LAB — COMPREHENSIVE METABOLIC PANEL
ALBUMIN: 4 g/dL (ref 3.5–5.0)
ALT: 28 U/L (ref 0–44)
AST: 24 U/L (ref 15–41)
Alkaline Phosphatase: 76 U/L (ref 38–126)
Anion gap: 6 (ref 5–15)
BUN: 26 mg/dL — AB (ref 6–20)
CO2: 27 mmol/L (ref 22–32)
CREATININE: 1.56 mg/dL — AB (ref 0.61–1.24)
Calcium: 9 mg/dL (ref 8.9–10.3)
Chloride: 105 mmol/L (ref 98–111)
GFR calc Af Amer: 57 mL/min — ABNORMAL LOW (ref >60)
GFR calc non Af Amer: 49 mL/min — ABNORMAL LOW (ref >60)
Glucose, Bld: 108 mg/dL — ABNORMAL HIGH (ref 70–99)
Potassium: 4 mmol/L (ref 3.5–5.1)
SODIUM: 138 mmol/L (ref 135–145)
Total Bilirubin: 0.4 mg/dL (ref 0.3–1.2)
Total Protein: 6.7 g/dL (ref 6.5–8.1)

## 2017-08-06 LAB — LIPASE, BLOOD: Lipase: 58 U/L — ABNORMAL HIGH (ref 11–51)

## 2017-08-06 LAB — PROTIME-INR
INR: 0.92
Prothrombin Time: 12.3 seconds (ref 11.4–15.2)

## 2017-08-06 LAB — URINALYSIS, MICROSCOPIC (REFLEX)
Bacteria, UA: NONE SEEN
Squamous Epithelial / LPF: NONE SEEN (ref 0–5)
WBC UA: NONE SEEN WBC/hpf (ref 0–5)

## 2017-08-06 LAB — OCCULT BLOOD X 1 CARD TO LAB, STOOL: FECAL OCCULT BLD: POSITIVE — AB

## 2017-08-06 MED ORDER — SODIUM CHLORIDE 0.9 % IV BOLUS
500.0000 mL | Freq: Once | INTRAVENOUS | Status: AC
Start: 2017-08-06 — End: 2017-08-06
  Administered 2017-08-06: 500 mL via INTRAVENOUS

## 2017-08-06 MED ORDER — MORPHINE SULFATE (PF) 4 MG/ML IV SOLN: 4.0000 mg | Freq: Once | INTRAVENOUS | Status: DC

## 2017-08-06 NOTE — ED Notes (Signed)
Patient transported to CT 

## 2017-08-06 NOTE — ED Notes (Signed)
Pt and FM given d/c instructions as per chart. Verbalize understanding. No questions.

## 2017-08-06 NOTE — ED Triage Notes (Signed)
Bright red rectal bleeding x 3 days, hx of hemorrhoids. Also reports lower abd cramping.

## 2017-08-06 NOTE — ED Provider Notes (Signed)
[facility] EMERGENCY DEPARTMENT Provider Note   CSN: [id number] Arrival date & time: 08/06/17  [facility]     History   Chief Complaint Chief Complaint  Patient presents with  . Rectal Bleeding    HPI Maurice Watkins is a 52 y.o. male with a remote history of kidney cancer status post total nephrectomy who presents emergency department today for rectal bleeding.  Patient notes over the last 2-3 days he has been having intermittent suprapubic/left lower quadrant abdominal pressure that he states feels like the sensation of having to defecate.  He notes he has had 1-2 loose stools with noted streaks of blood in the water as well as on the tissue paper when he wipes.  He denies any painful bowel movements.  He notes he has not been taking anything for his symptoms.  He notes nothing aggravates or relieves his symptoms.  Nothing brings on his pain.  He is unsure for how long the pain lasts for between episodes.  He states he does have a history of hemorrhoids in the past but has never had pain associated with them.  The patient reports that he has had a colonoscopy in the past that showed internal hemorrhoids.  He is unsure if it showed diverticulosis.  Patient denies any associated fever, chills, chest pain, shortness of breath, upper abdominal pain, melena, anticoagulation use, chronic NSAID use, alcohol use, lightheadedness, syncope. Patient has been using pepto bismol. No iron therapy. He notes prior nephrectomy without any other abdominal surgeries.  His last bowel movement was earlier today. Patient does take ASA 81mg  daily.  Patient's current pain level today is a 0/10.  HPI  Past Medical History:  Diagnosis Date  . Cancer of kidney ([place])   . HOH (hard of hearing)   . Hyperlipidemia     Patient Active Problem List   Diagnosis Date Noted  . Right shoulder pain 02/15/2017  . Chronic prostatitis 05/01/2015  . Family history of prostate cancer in father 05/01/2015  . Mixed  hyperlipidemia 05/04/2014  . Sensorineural hearing loss 10/16/2013  . Current smoker 10/16/2013  . ETD (eustachian tube dysfunction) 10/16/2013  . Generalized osteoarthritis of multiple sites 12/02/2012  . Allergic rhinitis due to pollen 12/02/2012    Past Surgical History:  Procedure Laterality Date  . TOTAL NEPHRECTOMY          Home Medications    Prior to Admission medications   Medication Sig Start Date End Date Taking? Authorizing Provider  Ascorbic Acid (VITAMIN C) 100 MG tablet Take by mouth.    [provider]  azelastine (ASTELIN) 0.1 % nasal spray Place into the nose. 04/07/16   [provider]  B Complex Vitamins (VITAMIN B COMPLEX PO) Take by mouth.    [provider]  Cholecalciferol (VITAMIN D-1000 MAX ST) 1000 units tablet Take by mouth.    [provider]  NICODERM CQ 21 MG/24HR patch  03/24/17   [provider]  nitroGLYCERIN (NITRODUR - DOSED IN MG/24 HR) 0.2 mg/hr patch Apply 1/4th patch to affected shoulder, change daily 02/15/17   Hudnall, Sharyn Lull, MD  oxybutynin (DITROPAN-XL) 10 MG 24 hr tablet  03/21/17   [provider]  pravastatin (PRAVACHOL) 20 MG tablet Take 20 mg by mouth daily.    [provider]  tamsulosin (FLOMAX) 0.4 MG CAPS capsule Take 1 capsule (0.4 mg total) by mouth daily. 02/27/17   Lawyer, Harrell Gave, PA-C  vitamin A 10000 UNIT capsule Take by mouth.  [provider]  vitamin E 100 UNIT capsule Take by mouth.    [provider]    Family History No family history on file.  Social History Social History   Tobacco Use  . Smoking status: Former Smoker    Packs/day: 0.50    Types: Cigarettes  . Smokeless tobacco: Never Used  Substance Use Topics  . Alcohol use: No    Frequency: Never  . Drug use: No     Allergies   Eggs or egg-derived products; Influenza vaccines; and Meloxicam   Review of Systems Review of Systems  All other systems reviewed and  are negative.    Physical Exam Updated Vital Signs BP 111/71 (BP Location: Right Arm)   Pulse 81   Temp 98 F (36.7 C) (Oral)   Resp 16   Ht 5\' 6"  (1.676 m)   Wt 64.9 kg (143 lb)   SpO2 99%   BMI 23.08 kg/m   Physical Exam  Constitutional: He appears well-developed and well-nourished.  HENT:  Head: Normocephalic and atraumatic.  Right Ear: External ear normal.  Left Ear: External ear normal.  Nose: Nose normal.  Mouth/Throat: Uvula is midline, oropharynx is clear and moist and mucous membranes are normal. No tonsillar exudate.  Eyes: Pupils are equal, round, and reactive to light. Right eye exhibits no discharge. Left eye exhibits no discharge. No scleral icterus.  Neck: Trachea normal. Neck supple. No spinous process tenderness present. No neck rigidity. Normal range of motion present.  Cardiovascular: Normal rate, regular rhythm and intact distal pulses.  No murmur heard. Pulses:      Radial pulses are 2+ on the right side, and 2+ on the left side.       Dorsalis pedis pulses are 2+ on the right side, and 2+ on the left side.       Posterior tibial pulses are 2+ on the right side, and 2+ on the left side.  No lower extremity swelling or edema. Calves symmetric in size bilaterally.  Pulmonary/Chest: Effort normal and breath sounds normal. He exhibits no tenderness.  Abdominal: Soft. Bowel sounds are normal. He exhibits no distension. There is tenderness in the suprapubic area and left lower quadrant. There is no rigidity, no rebound, no guarding and no CVA tenderness.  Genitourinary:  Genitourinary Comments: Chaperone was present.  Patient without pain around the rectal area. Perianal sensory intact. No external fissure's palpated or examined. External hemorrhoids noted without thrombosis or bleeding. No induration of the skin or swelling. Digital Rectal Exam reveals sphincter with good tone. No masses palpated. Stool color is brown with no overt blood or melena.    Musculoskeletal: He exhibits no edema.  Lymphadenopathy:    He has no cervical adenopathy.  Neurological: He is alert.  Skin: Skin is warm and dry. No rash noted. He is not diaphoretic.  Psychiatric: He has a normal mood and affect.  Nursing note and vitals reviewed.    ED Treatments / Results  Labs (all labs ordered are listed, but only abnormal results are displayed) Labs Reviewed  CBC WITH DIFFERENTIAL/PLATELET - Abnormal; Notable for the following components:      Result Value   RBC 4.09 (*)    Hemoglobin 12.6 (*)    HCT 36.6 (*)    Neutro Abs 1.6 (*)    All other components within normal limits  URINALYSIS, ROUTINE W REFLEX MICROSCOPIC - Abnormal; Notable for the following components:   Specific Gravity, Urine <1.005 (*)    Hgb  urine dipstick SMALL (*)    All other components within normal limits  OCCULT BLOOD X 1 CARD TO LAB, STOOL - Abnormal; Notable for the following components:   Fecal Occult Bld POSITIVE (*)    All other components within normal limits  COMPREHENSIVE METABOLIC PANEL - Abnormal; Notable for the following components:   Glucose, Bld 108 (*)    BUN 26 (*)    Creatinine, Ser 1.56 (*)    GFR calc non Af Amer 49 (*)    GFR calc Af Amer 57 (*)    All other components within normal limits  LIPASE, BLOOD - Abnormal; Notable for the following components:   Lipase 58 (*)    All other components within normal limits  PROTIME-INR  URINALYSIS, MICROSCOPIC (REFLEX)    EKG None  Radiology Ct Abdomen Pelvis Wo Contrast  Result Date: 08/06/2017 CLINICAL DATA:  Bright red rectal bleeding for 3 days. History of hemorrhoids. Lower abdominal cramping. EXAM: CT ABDOMEN AND PELVIS WITHOUT CONTRAST TECHNIQUE: Multidetector CT imaging of the abdomen and pelvis was performed following the standard protocol without IV contrast. COMPARISON:  03/18/2017 FINDINGS: Lower chest: Lung bases are clear. Hepatobiliary: No focal liver abnormality is seen. No gallstones,  gallbladder wall thickening, or biliary dilatation. Pancreas: Unremarkable. No pancreatic ductal dilatation or surrounding inflammatory changes. Spleen: Normal in size without focal abnormality. Adrenals/Urinary Tract: No adrenal gland nodules. Surgical absence of the left kidney. Right kidney demonstrates no hydronephrosis or hydroureter. No renal or ureteral stones. Bladder wall is not thickened. No bladder stones identified. Stomach/Bowel: Stomach, small bowel, and colon are not abnormally distended. No wall thickening or inflammatory changes. Scattered diverticula in the sigmoid colon. Scattered stool in the colon. Appendix is normal. Vascular/Lymphatic: Aortic atherosclerosis. No enlarged abdominal or pelvic lymph nodes. Reproductive: Prostate gland is not enlarged. Other: No abdominal wall hernia or abnormality. No abdominopelvic ascites. Musculoskeletal: No acute or significant osseous findings. IMPRESSION: 1. No acute process demonstrated in the abdomen or pelvis. No evidence of bowel obstruction or inflammation. 2. Surgical absence of the left kidney. 3. Aortic atherosclerosis. Electronically Signed   By: Lucienne Capers M.D.   On: 08/06/2017 21:41    Procedures Procedures (including critical care time)  Medications Ordered in ED Medications  sodium chloride 0.9 % bolus 500 mL (has no administration in time range)     Initial Impression / Assessment and Plan / ED Course  I have reviewed the triage vital signs and the nursing notes.  Pertinent labs & imaging results that were available during my care of the patient were reviewed by me and considered in my medical decision making (see chart for details).     52 y.o. male with a history on chart review with history of grade 2 internal hemorrhoids who presents emergency department today for rectal bleeding as well as lower abdominal pain.  Patient reports this is typical of his prior flares with internal hemorrhoids however has never had  lower abdominal cramping associated with them.  He notes he has had some loose stools.  He was previously followed by gastroenterology, Dr. Darlyne Russian (last visit April of this year on chart review).  Patient's vital signs are reassuring on presentation.  He is without fever, tachycardia, tachypnea, hypoxia or hypotension.  There is no evidence of thrombosed hemorrhoids or bleeding external hemorrhoids on rectal exam.  Fecal occult is positive.  Given patient states that he has had pain unlike prior episodes will order CT to evaluate.  Patient with  mild anemia of 12.6.  No prior hemoglobin in 2019 to compare. This does not require transfusion. Patient's vital signs remained stable.  He has no leukocytosis.  Kidney function is mildly elevated compared to prior on 4/30 by his PCP.  Given patient has one kidney and mild elevation of creatinine will avoid contrast.  There is no significant electrolyte derangements.  LFTs are within normal limits.  PT INR 0.92.  UA unremarkable.  Patient refusing pain medication at this time stating it does not have any pain.  CT is unremarkable.  There is no evidence of diverticulitis, free air or other intra-abdominal process.  Patient is requesting to leave.  Given patient's reassuring work-up with stable vital signs I feel this is appropriate and refer the patient back to his gastroenterologist, Dr. Darlyne Russian.  Possibly related to patient's internal hemorrhoids.  It appears that he was recommended to have a flexible sigmoidoscopy by his gastroenterologist during his last visit in April but did not see any follow-up notes on this. I advised the patient to follow-up with PCP this week. Specific return precautions discussed. Time was given for all questions to be answered. The patient verbalized understanding and agreement with plan. The patient is hemodynamically stable appears safe for discharge home.  Vital signs at time of discharge.  Blood pressure 113/84, pulse 76,  temperature 97.8 F (36.6 C), temperature source Oral, resp. rate 18, height 5\' 6"  (1.676 m), weight 64.9 kg (143 lb), SpO2 100 %.  Final Clinical Impressions(s) / ED Diagnoses   Final diagnoses:  Rectal bleeding  Lower abdominal pain    ED Discharge Orders    None       Lorelle Gibbs 08/06/17 2236    Quintella Reichert, MD 08/07/17 1547

## 2017-08-06 NOTE — Discharge Instructions (Signed)
Follow attached handouts. Avoid alcohol and nonsteroidal anti-inflammatory medications such as ibuprofen.  I would like you to follow-up with your gastroenterologist this week. Your Creatinine was mildly elevated and your blood counts were midly decreased. You will need repeat testing this week with your primary care doctor. Please continue home medications.  If you develop worsening or new concerning symptoms you can return to the emergency department for re-evaluation.   et help right away if: You have new or increased rectal bleeding. You have black or dark red stools. You vomit blood or something that looks like coffee grounds. You have pain or tenderness in your abdomen. You have a fever. You feel weak. You feel nauseous. You faint. You have severe pain in your rectum. You cannot have a bowel movement.

## 2017-08-16 DIAGNOSIS — K921 Melena: Secondary | ICD-10-CM | POA: Diagnosis not present

## 2017-08-16 DIAGNOSIS — R197 Diarrhea, unspecified: Secondary | ICD-10-CM | POA: Diagnosis not present

## 2017-08-16 DIAGNOSIS — K6289 Other specified diseases of anus and rectum: Secondary | ICD-10-CM | POA: Diagnosis not present

## 2017-08-17 DIAGNOSIS — R197 Diarrhea, unspecified: Secondary | ICD-10-CM | POA: Diagnosis not present

## 2017-08-17 DIAGNOSIS — K921 Melena: Secondary | ICD-10-CM | POA: Diagnosis not present

## 2017-08-19 DIAGNOSIS — N411 Chronic prostatitis: Secondary | ICD-10-CM | POA: Diagnosis not present

## 2017-08-19 DIAGNOSIS — C642 Malignant neoplasm of left kidney, except renal pelvis: Secondary | ICD-10-CM | POA: Diagnosis not present

## 2017-08-19 DIAGNOSIS — N5089 Other specified disorders of the male genital organs: Secondary | ICD-10-CM | POA: Diagnosis not present

## 2017-09-13 DIAGNOSIS — K6389 Other specified diseases of intestine: Secondary | ICD-10-CM | POA: Diagnosis not present

## 2017-09-13 DIAGNOSIS — K921 Melena: Secondary | ICD-10-CM | POA: Diagnosis not present

## 2017-09-13 DIAGNOSIS — K642 Third degree hemorrhoids: Secondary | ICD-10-CM | POA: Diagnosis not present

## 2017-09-15 DIAGNOSIS — Z85528 Personal history of other malignant neoplasm of kidney: Secondary | ICD-10-CM | POA: Diagnosis not present

## 2017-09-15 DIAGNOSIS — N401 Enlarged prostate with lower urinary tract symptoms: Secondary | ICD-10-CM | POA: Diagnosis not present

## 2017-09-15 DIAGNOSIS — N138 Other obstructive and reflux uropathy: Secondary | ICD-10-CM | POA: Diagnosis not present

## 2017-09-15 DIAGNOSIS — Z08 Encounter for follow-up examination after completed treatment for malignant neoplasm: Secondary | ICD-10-CM | POA: Diagnosis not present

## 2017-09-15 DIAGNOSIS — Z87891 Personal history of nicotine dependence: Secondary | ICD-10-CM | POA: Diagnosis not present

## 2017-10-14 DIAGNOSIS — K6289 Other specified diseases of anus and rectum: Secondary | ICD-10-CM | POA: Diagnosis not present

## 2017-10-14 DIAGNOSIS — K648 Other hemorrhoids: Secondary | ICD-10-CM | POA: Diagnosis not present

## 2017-10-14 DIAGNOSIS — K921 Melena: Secondary | ICD-10-CM | POA: Diagnosis not present

## 2017-11-02 DIAGNOSIS — Z87891 Personal history of nicotine dependence: Secondary | ICD-10-CM | POA: Diagnosis not present

## 2017-11-02 DIAGNOSIS — Z85528 Personal history of other malignant neoplasm of kidney: Secondary | ICD-10-CM | POA: Diagnosis not present

## 2017-11-02 DIAGNOSIS — N411 Chronic prostatitis: Secondary | ICD-10-CM | POA: Diagnosis not present

## 2017-12-07 DIAGNOSIS — E875 Hyperkalemia: Secondary | ICD-10-CM | POA: Diagnosis not present

## 2017-12-07 DIAGNOSIS — E871 Hypo-osmolality and hyponatremia: Secondary | ICD-10-CM | POA: Diagnosis not present

## 2017-12-07 DIAGNOSIS — R310 Gross hematuria: Secondary | ICD-10-CM | POA: Diagnosis not present

## 2017-12-07 DIAGNOSIS — E559 Vitamin D deficiency, unspecified: Secondary | ICD-10-CM | POA: Diagnosis not present

## 2017-12-07 DIAGNOSIS — N179 Acute kidney failure, unspecified: Secondary | ICD-10-CM | POA: Diagnosis not present

## 2017-12-16 DIAGNOSIS — H903 Sensorineural hearing loss, bilateral: Secondary | ICD-10-CM | POA: Diagnosis not present

## 2017-12-19 DIAGNOSIS — N390 Urinary tract infection, site not specified: Secondary | ICD-10-CM | POA: Diagnosis not present

## 2017-12-19 DIAGNOSIS — E875 Hyperkalemia: Secondary | ICD-10-CM | POA: Diagnosis not present

## 2017-12-19 DIAGNOSIS — R310 Gross hematuria: Secondary | ICD-10-CM | POA: Diagnosis not present

## 2017-12-19 DIAGNOSIS — N179 Acute kidney failure, unspecified: Secondary | ICD-10-CM | POA: Diagnosis not present

## 2017-12-20 DIAGNOSIS — N401 Enlarged prostate with lower urinary tract symptoms: Secondary | ICD-10-CM | POA: Diagnosis not present

## 2017-12-20 DIAGNOSIS — N179 Acute kidney failure, unspecified: Secondary | ICD-10-CM | POA: Diagnosis not present

## 2017-12-20 DIAGNOSIS — R310 Gross hematuria: Secondary | ICD-10-CM | POA: Diagnosis not present

## 2017-12-20 DIAGNOSIS — N411 Chronic prostatitis: Secondary | ICD-10-CM | POA: Diagnosis not present

## 2017-12-21 DIAGNOSIS — Z7689 Persons encountering health services in other specified circumstances: Secondary | ICD-10-CM | POA: Diagnosis not present

## 2017-12-26 DIAGNOSIS — N179 Acute kidney failure, unspecified: Secondary | ICD-10-CM | POA: Diagnosis not present

## 2017-12-26 DIAGNOSIS — E875 Hyperkalemia: Secondary | ICD-10-CM | POA: Diagnosis not present

## 2017-12-26 DIAGNOSIS — N390 Urinary tract infection, site not specified: Secondary | ICD-10-CM | POA: Diagnosis not present

## 2017-12-26 DIAGNOSIS — Z13220 Encounter for screening for lipoid disorders: Secondary | ICD-10-CM | POA: Diagnosis not present

## 2017-12-26 DIAGNOSIS — Z0000 Encounter for general adult medical examination without abnormal findings: Secondary | ICD-10-CM | POA: Diagnosis not present

## 2017-12-26 DIAGNOSIS — R310 Gross hematuria: Secondary | ICD-10-CM | POA: Diagnosis not present

## 2017-12-27 DIAGNOSIS — Z13228 Encounter for screening for other metabolic disorders: Secondary | ICD-10-CM | POA: Diagnosis not present

## 2017-12-27 DIAGNOSIS — Z13220 Encounter for screening for lipoid disorders: Secondary | ICD-10-CM | POA: Diagnosis not present

## 2018-02-03 DIAGNOSIS — E875 Hyperkalemia: Secondary | ICD-10-CM | POA: Diagnosis not present

## 2018-02-03 DIAGNOSIS — R310 Gross hematuria: Secondary | ICD-10-CM | POA: Diagnosis not present

## 2018-02-03 DIAGNOSIS — N179 Acute kidney failure, unspecified: Secondary | ICD-10-CM | POA: Diagnosis not present

## 2018-02-06 DIAGNOSIS — E875 Hyperkalemia: Secondary | ICD-10-CM | POA: Diagnosis not present

## 2018-02-06 DIAGNOSIS — R310 Gross hematuria: Secondary | ICD-10-CM | POA: Diagnosis not present

## 2018-02-06 DIAGNOSIS — N179 Acute kidney failure, unspecified: Secondary | ICD-10-CM | POA: Diagnosis not present

## 2018-02-06 DIAGNOSIS — N390 Urinary tract infection, site not specified: Secondary | ICD-10-CM | POA: Diagnosis not present

## 2018-02-08 DIAGNOSIS — M549 Dorsalgia, unspecified: Secondary | ICD-10-CM | POA: Diagnosis not present

## 2018-02-08 DIAGNOSIS — R079 Chest pain, unspecified: Secondary | ICD-10-CM | POA: Diagnosis not present

## 2018-02-08 DIAGNOSIS — R1013 Epigastric pain: Secondary | ICD-10-CM | POA: Diagnosis not present

## 2018-03-01 DIAGNOSIS — R079 Chest pain, unspecified: Secondary | ICD-10-CM | POA: Diagnosis not present

## 2018-03-13 DIAGNOSIS — K625 Hemorrhage of anus and rectum: Secondary | ICD-10-CM | POA: Diagnosis not present

## 2018-05-01 DIAGNOSIS — N400 Enlarged prostate without lower urinary tract symptoms: Secondary | ICD-10-CM | POA: Diagnosis not present

## 2018-05-08 DIAGNOSIS — N138 Other obstructive and reflux uropathy: Secondary | ICD-10-CM | POA: Diagnosis not present

## 2018-05-08 DIAGNOSIS — R310 Gross hematuria: Secondary | ICD-10-CM | POA: Diagnosis not present

## 2018-05-08 DIAGNOSIS — N400 Enlarged prostate without lower urinary tract symptoms: Secondary | ICD-10-CM | POA: Diagnosis not present

## 2018-05-08 DIAGNOSIS — C642 Malignant neoplasm of left kidney, except renal pelvis: Secondary | ICD-10-CM | POA: Diagnosis not present

## 2018-05-08 DIAGNOSIS — K769 Liver disease, unspecified: Secondary | ICD-10-CM | POA: Diagnosis not present

## 2018-05-08 DIAGNOSIS — C649 Malignant neoplasm of unspecified kidney, except renal pelvis: Secondary | ICD-10-CM | POA: Diagnosis not present

## 2018-05-08 DIAGNOSIS — R319 Hematuria, unspecified: Secondary | ICD-10-CM | POA: Diagnosis not present

## 2018-05-08 DIAGNOSIS — Z85528 Personal history of other malignant neoplasm of kidney: Secondary | ICD-10-CM | POA: Diagnosis not present

## 2018-05-08 DIAGNOSIS — N401 Enlarged prostate with lower urinary tract symptoms: Secondary | ICD-10-CM | POA: Diagnosis not present

## 2018-06-05 DIAGNOSIS — D1803 Hemangioma of intra-abdominal structures: Secondary | ICD-10-CM | POA: Diagnosis not present

## 2019-05-25 IMAGING — CT CT ABD-PELV W/O CM
2 of 4 series · 16 of 46 positions shown, 18 images · non-contrast
Comparison: 03/18/2017

CLINICAL DATA: Bright red rectal bleeding for 3 days. History of
hemorrhoids. Lower abdominal cramping.

EXAM:
CT ABDOMEN AND PELVIS WITHOUT CONTRAST
TECHNIQUE: Multidetector CT imaging of the abdomen and pelvis was performed
following the standard protocol without IV contrast.

[Series 2: axial st · axial · 0.74mm/px · z∈[-818,-428]mm · 13 of 86 slices shown, 15 images]
[im 4/86  soft-tissue]
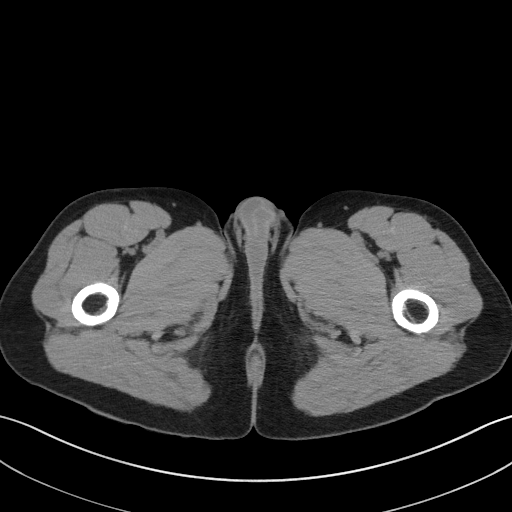
[im 4/86  bone]
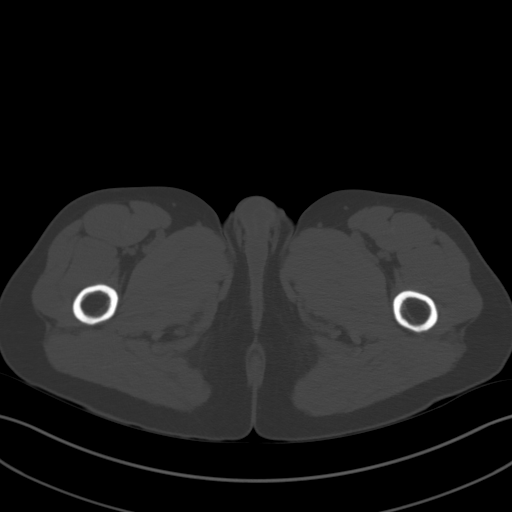
[im 12/86  soft-tissue]
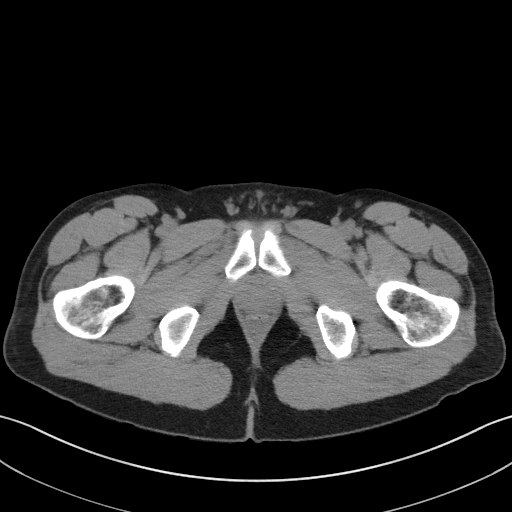
[im 19/86  soft-tissue]
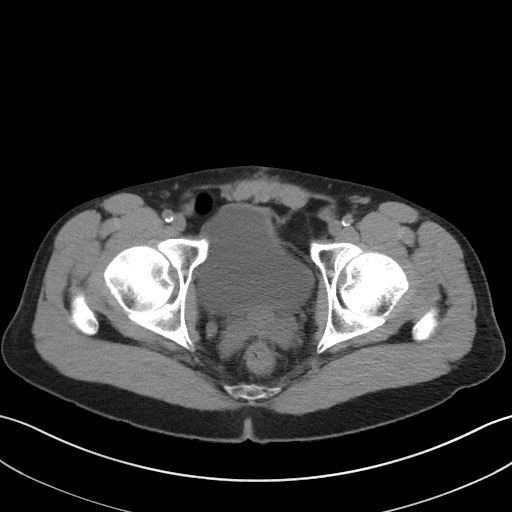
[im 23/86  soft-tissue]
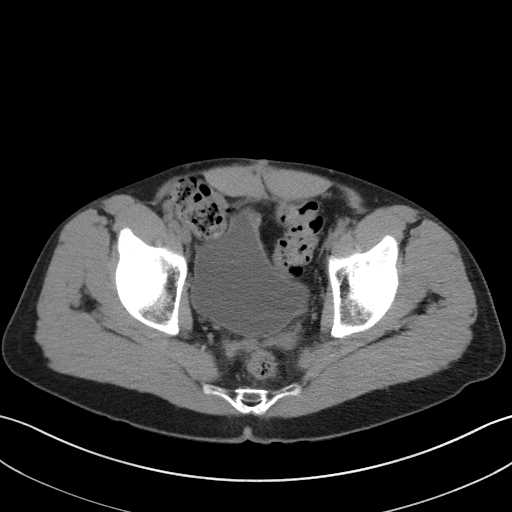
[im 30/86  soft-tissue]
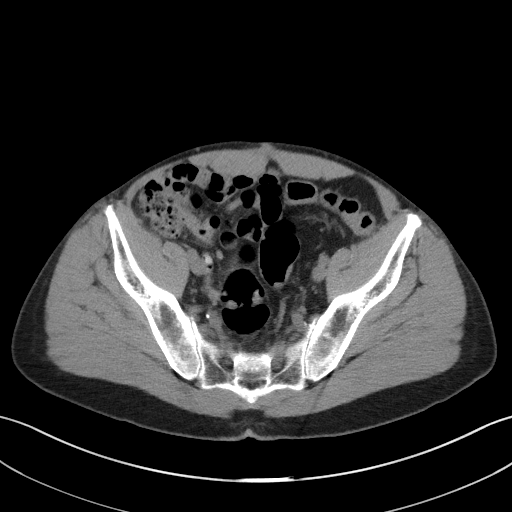
[im 37/86  soft-tissue]
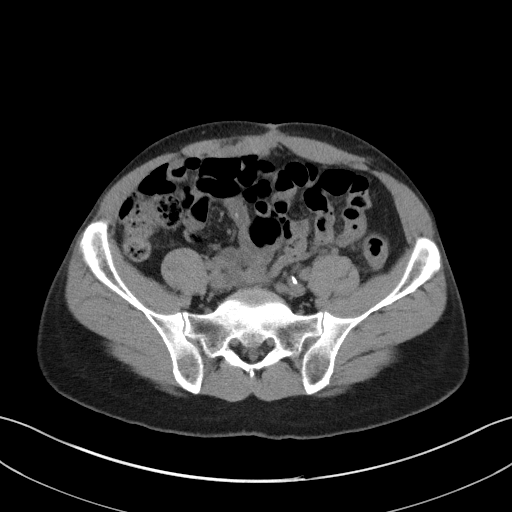
[im 45/86  soft-tissue]
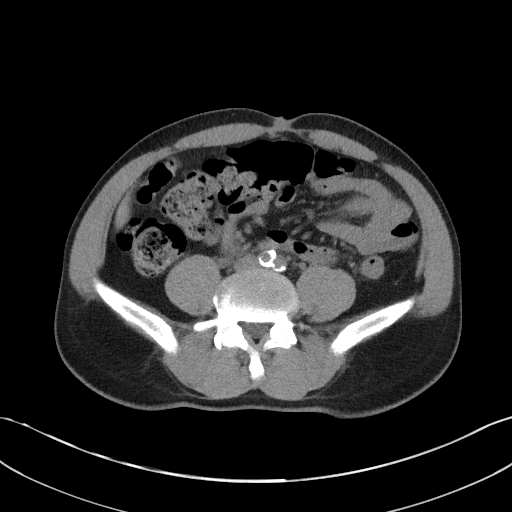
[im 49/86  soft-tissue]
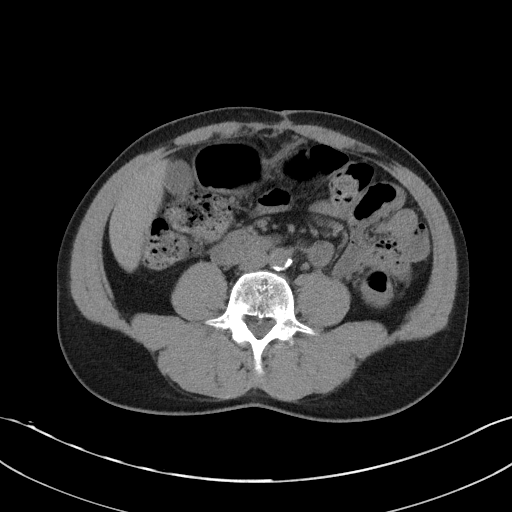
[im 56/86  soft-tissue]
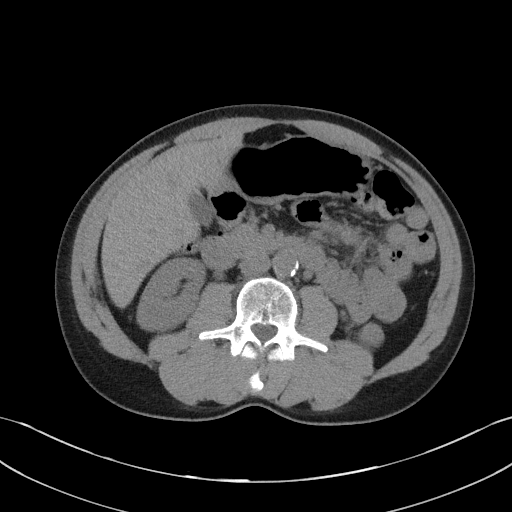
[im 56/86  bone]
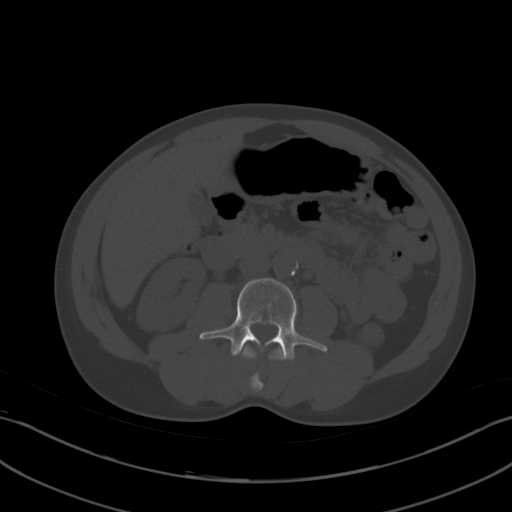
[im 63/86  soft-tissue]
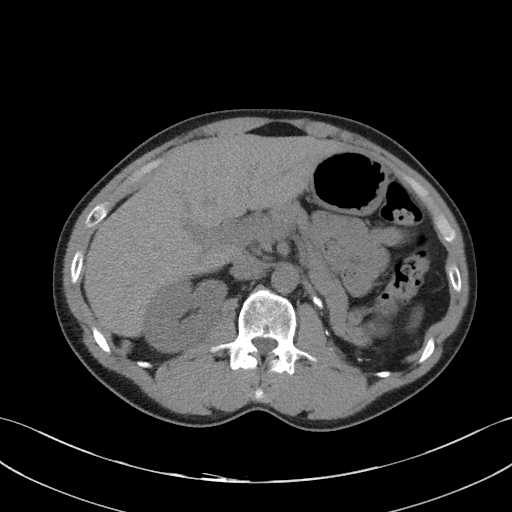
[im 67/86  soft-tissue]
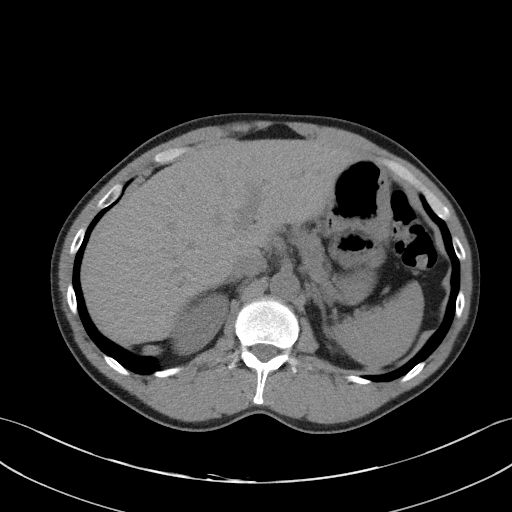
[im 74/86  soft-tissue]
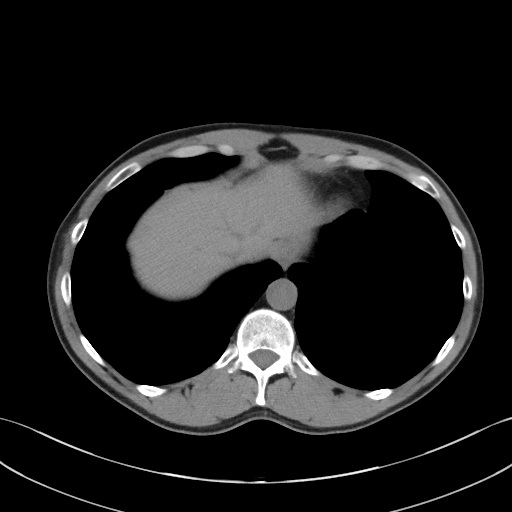
[im 82/86  soft-tissue]
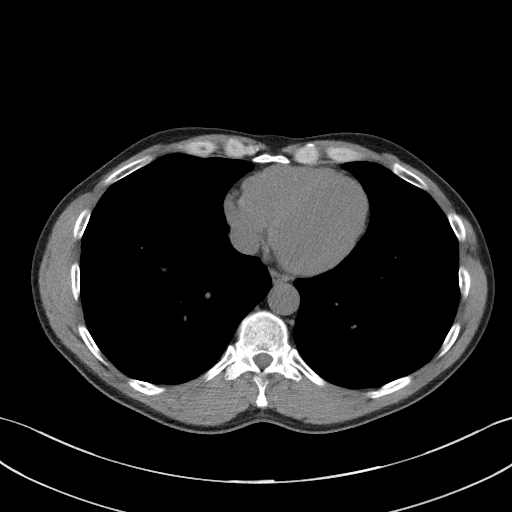

[Series 5: coronal st · coronal · 0.71mm/px · 3 of 85 slices shown]
[im 29/85  soft-tissue]
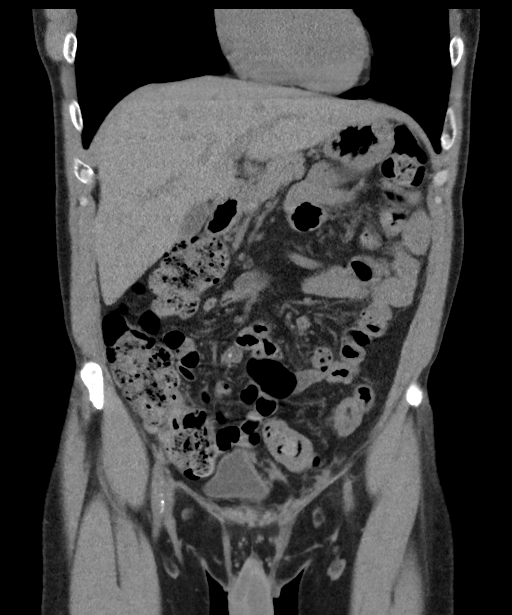
[im 38/85  soft-tissue]
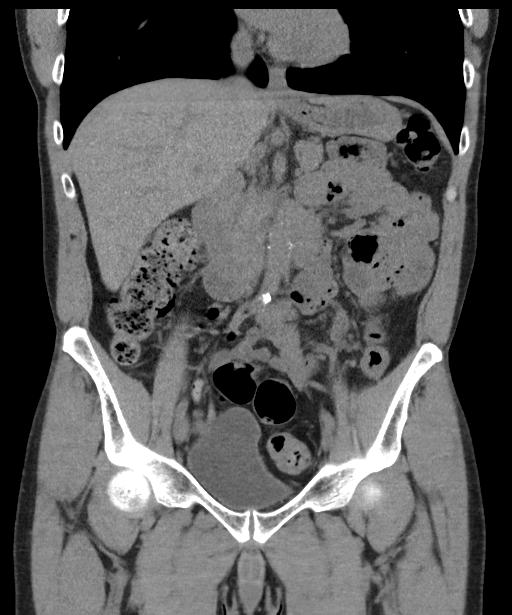
[im 47/85  soft-tissue]
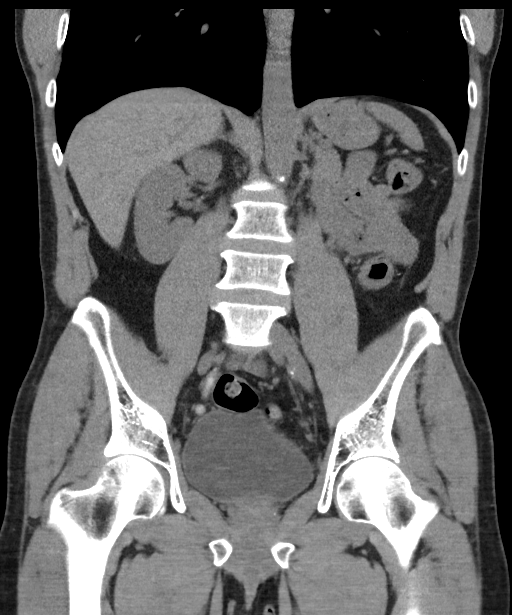

[16 of 46 positions shown; findings below may reference images not displayed]

FINDINGS: Lower chest: Lung bases are clear.

Hepatobiliary: No focal liver abnormality is seen. No gallstones,
gallbladder wall thickening, or biliary dilatation.

Pancreas: Unremarkable. No pancreatic ductal dilatation or
surrounding inflammatory changes.

Spleen: Normal in size without focal abnormality.

Adrenals/Urinary Tract: No adrenal gland nodules. Surgical absence
of the left kidney. Right kidney demonstrates no hydronephrosis or
hydroureter. No renal or ureteral stones. Bladder wall is not
thickened. No bladder stones identified.

Stomach/Bowel: Stomach, small bowel, and colon are not abnormally
distended. No wall thickening or inflammatory changes. Scattered
diverticula in the sigmoid colon. Scattered stool in the colon.
Appendix is normal.

Vascular/Lymphatic: Aortic atherosclerosis. No enlarged abdominal or
pelvic lymph nodes.

Reproductive: Prostate gland is not enlarged.

Other: No abdominal wall hernia or abnormality. No abdominopelvic
ascites.

Musculoskeletal: No acute or significant osseous findings.
IMPRESSION: 1. No acute process demonstrated in the abdomen or pelvis. No
evidence of bowel obstruction or inflammation.
2. Surgical absence of the left kidney.
3. Aortic atherosclerosis.
